# Patient Record
Sex: Male | Born: 2000 | Race: White | Hispanic: No | Marital: Single | Smoking: Never smoker
Health system: Southern US, Community
[De-identification: ages and names within clinical notes are randomized; demographics above are authoritative.]

## PROBLEM LIST (undated history)

## (undated) DIAGNOSIS — F909 Attention-deficit hyperactivity disorder, unspecified type: Secondary | ICD-10-CM

---
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
## 2018-02-27 DIAGNOSIS — F900 Attention-deficit hyperactivity disorder, predominantly inattentive type: Secondary | ICD-10-CM | POA: Diagnosis not present

## 2018-02-27 DIAGNOSIS — Z113 Encounter for screening for infections with a predominantly sexual mode of transmission: Secondary | ICD-10-CM | POA: Diagnosis not present

## 2018-02-27 DIAGNOSIS — Z713 Dietary counseling and surveillance: Secondary | ICD-10-CM | POA: Diagnosis not present

## 2018-02-27 DIAGNOSIS — Z1331 Encounter for screening for depression: Secondary | ICD-10-CM | POA: Diagnosis not present

## 2018-02-27 DIAGNOSIS — Z13220 Encounter for screening for lipoid disorders: Secondary | ICD-10-CM | POA: Diagnosis not present

## 2018-02-27 DIAGNOSIS — Z6828 Body mass index (BMI) 28.0-28.9, adult: Secondary | ICD-10-CM | POA: Diagnosis not present

## 2018-02-27 DIAGNOSIS — R030 Elevated blood-pressure reading, without diagnosis of hypertension: Secondary | ICD-10-CM | POA: Diagnosis not present

## 2018-02-27 DIAGNOSIS — Z0001 Encounter for general adult medical examination with abnormal findings: Secondary | ICD-10-CM | POA: Diagnosis not present

## 2018-07-31 DIAGNOSIS — F909 Attention-deficit hyperactivity disorder, unspecified type: Secondary | ICD-10-CM | POA: Diagnosis not present

## 2018-11-30 ENCOUNTER — Other Ambulatory Visit: Payer: Self-pay

## 2018-11-30 DIAGNOSIS — Z20822 Contact with and (suspected) exposure to covid-19: Secondary | ICD-10-CM

## 2018-11-30 DIAGNOSIS — Z20828 Contact with and (suspected) exposure to other viral communicable diseases: Secondary | ICD-10-CM | POA: Diagnosis not present

## 2018-12-01 LAB — NOVEL CORONAVIRUS, NAA: SARS-CoV-2, NAA: NOT DETECTED

## 2018-12-13 DIAGNOSIS — Z23 Encounter for immunization: Secondary | ICD-10-CM | POA: Diagnosis not present

## 2018-12-31 DIAGNOSIS — F900 Attention-deficit hyperactivity disorder, predominantly inattentive type: Secondary | ICD-10-CM | POA: Diagnosis not present

## 2019-02-20 ENCOUNTER — Ambulatory Visit: Payer: BC Managed Care – PPO | Attending: Internal Medicine

## 2019-02-20 DIAGNOSIS — Z20828 Contact with and (suspected) exposure to other viral communicable diseases: Secondary | ICD-10-CM | POA: Diagnosis not present

## 2019-02-20 DIAGNOSIS — Z20822 Contact with and (suspected) exposure to covid-19: Secondary | ICD-10-CM

## 2019-02-21 LAB — NOVEL CORONAVIRUS, NAA: SARS-CoV-2, NAA: NOT DETECTED

## 2019-04-02 DIAGNOSIS — F902 Attention-deficit hyperactivity disorder, combined type: Secondary | ICD-10-CM | POA: Diagnosis not present

## 2019-05-29 DIAGNOSIS — Z20828 Contact with and (suspected) exposure to other viral communicable diseases: Secondary | ICD-10-CM | POA: Diagnosis not present

## 2019-05-30 DIAGNOSIS — Z20828 Contact with and (suspected) exposure to other viral communicable diseases: Secondary | ICD-10-CM | POA: Diagnosis not present

## 2019-05-30 DIAGNOSIS — Z03818 Encounter for observation for suspected exposure to other biological agents ruled out: Secondary | ICD-10-CM | POA: Diagnosis not present

## 2019-07-04 DIAGNOSIS — F902 Attention-deficit hyperactivity disorder, combined type: Secondary | ICD-10-CM | POA: Diagnosis not present

## 2019-10-03 DIAGNOSIS — F902 Attention-deficit hyperactivity disorder, combined type: Secondary | ICD-10-CM | POA: Diagnosis not present

## 2020-08-06 ENCOUNTER — Encounter (HOSPITAL_BASED_OUTPATIENT_CLINIC_OR_DEPARTMENT_OTHER): Payer: BC Managed Care – PPO | Attending: Internal Medicine | Admitting: Internal Medicine

## 2020-08-06 ENCOUNTER — Other Ambulatory Visit (HOSPITAL_COMMUNITY)
Admission: RE | Admit: 2020-08-06 | Discharge: 2020-08-06 | Disposition: A | Discharge status: Home / Self Care | Payer: BC Managed Care – PPO | Source: Other Acute Inpatient Hospital | Attending: Internal Medicine | Admitting: Internal Medicine

## 2020-08-06 ENCOUNTER — Other Ambulatory Visit: Payer: Self-pay

## 2020-08-06 DIAGNOSIS — L600 Ingrowing nail: Secondary | ICD-10-CM | POA: Diagnosis not present

## 2020-08-06 DIAGNOSIS — L03031 Cellulitis of right toe: Secondary | ICD-10-CM | POA: Insufficient documentation

## 2020-08-06 NOTE — Progress Notes (Signed)
JOHNCARLOS, HOLTSCLAW ([id number]) Visit Report for 08/06/2020 Abuse/Suicide Risk Screen Details Patient Name: Date of Service: Ulice Dash MIN 08/06/2020 9:00 A M Medical Record Number: [id number] Patient Account Number: [id number] Date of Birth/Sex: Treating RN: 09-15-2000 (20 y.o. Male) Antonieta Iba Primary Care Annel Zunker: Ronalee Belts NET Other Clinician: Referring Alexandr Yaworski: Treating Kellye Mizner/Extender: Barbaraann Share NET Weeks in Treatment: 0 Abuse/Suicide Risk Screen Items Answer ABUSE RISK SCREEN: Has anyone close to you tried to hurt or harm you recentlyo No Do you feel uncomfortable with anyone in your familyo No Has anyone forced you do things that you didnt want to doo No Electronic Signature(s) Signed: 08/06/2020 6:07:06 PM By: Antonieta Iba Entered By: Antonieta Iba on 08/06/2020 09:05:08 -------------------------------------------------------------------------------- Activities of Daily Living Details Patient Name: Date of Service: Ulice Dash MIN 08/06/2020 9:00 A M Medical Record Number: [id number] Patient Account Number: [id number] Date of Birth/Sex: Treating RN: [birth year] (20 y.o. Male) Antonieta Iba Primary Care Tryton Bodi: Ronalee Belts NET Other Clinician: Referring Olvin Rohr: Treating Drayk Humbarger/Extender: Barbaraann Share NET Weeks in Treatment: 0 Activities of Daily Living Items Answer Activities of Daily Living (Please select one for each item) Drive Automobile Completely Able T Medications ake Completely Able Use T elephone Completely Able Care for Appearance Completely Able Use T oilet Completely Able Bath / Shower Completely Able Dress Self Completely Able Feed Self Completely Able Walk Completely Able Get In / Out Bed Completely Able Housework Completely Able Prepare Meals Completely Able Handle Money Completely Able Shop for Self Completely Able Electronic Signature(s) Signed: 08/06/2020 6:07:06 PM By: Antonieta Iba Entered By: Antonieta Iba on 08/06/2020 09:05:26 -------------------------------------------------------------------------------- Education Screening Details Patient Name: Date of Service: Ulice Dash MIN 08/06/2020 9:00 A M Medical Record Number: [id number] Patient Account Number: [id number] Date of Birth/Sex: Treating RN: May 30, 2000 (20 y.o. Male) Antonieta Iba Primary Care Keiley Levey: Ronalee Belts NET Other Clinician: Referring Kim Lauver: Treating Monda Chastain/Extender: Barbaraann Share NET Weeks in Treatment: 0 Primary Learner Assessed: Patient Learning Preferences/Education Level/Primary Language Learning Preference: Explanation, Demonstration, Printed Material Highest Education Level: College or Above Preferred Language: English Cognitive Barrier Language Barrier: No Translator Needed: No Memory Deficit: No Emotional Barrier: No Cultural/Religious Beliefs Affecting Medical Care: No Physical Barrier Impaired Vision: No Impaired Hearing: No Decreased Hand dexterity: No Knowledge/Comprehension Knowledge Level: High Comprehension Level: High Ability to understand written instructions: High Ability to understand verbal instructions: High Motivation Anxiety Level: Calm Cooperation: Cooperative Education Importance: Acknowledges Need Interest in Health Problems: Asks Questions Perception: Coherent Willingness to Engage in Self-Management High Activities: Readiness to Engage in Self-Management High Activities: Electronic Signature(s) Signed: 08/06/2020 6:07:06 PM By: Antonieta Iba Entered By: Antonieta Iba on 08/06/2020 09:05:55 -------------------------------------------------------------------------------- Fall Risk Assessment Details Patient Name: Date of Service: [name], [name] MIN 08/06/2020 9:00 A M Medical Record Number: [id number] Patient Account Number: [id number] Date of Birth/Sex: Treating RN: January 25, 2001 (20 y.o. Male) Antonieta Iba Primary Care Haylie Mccutcheon: Ronalee Belts NET Other  Clinician: Referring Tayshon Winker: Treating Orbin Mayeux/Extender: Barbaraann Share NET Weeks in Treatment: 0 Fall Risk Assessment Items Have you had 2 or more falls in the last 12 monthso 0 No Have you had any fall that resulted in injury in the last 12 monthso 0 No FALLS RISK SCREEN History of falling - immediate or within 3 months 0 No Secondary diagnosis (Do you have 2 or more medical diagnoseso) 0 No Ambulatory aid None/bed rest/wheelchair/nurse 0 Yes Crutches/cane/walker 0 No Furniture 0 No Intravenous therapy Access/Saline/Heparin Lock 0 No Gait/Transferring Normal/ bed rest/ wheelchair  0 Yes Weak (short steps with or without shuffle, stooped but able to lift head while walking, may seek 0 No support from furniture) Impaired (short steps with shuffle, may have difficulty arising from chair, head down, impaired 0 No balance) Mental Status Oriented to own ability 0 Yes Electronic Signature(s) Signed: 08/06/2020 6:07:06 PM By: Antonieta Iba Entered By: Antonieta Iba on 08/06/2020 09:06:14 -------------------------------------------------------------------------------- Foot Assessment Details Patient Name: Date of Service: Ulice Dash MIN 08/06/2020 9:00 A M Medical Record Number: [id number] Patient Account Number: [id number] Date of Birth/Sex: Treating RN: [birth year] (20 y.o. Male) Antonieta Iba Primary Care Adriaan Maltese: Ronalee Belts NET Other Clinician: Referring Gisel Vipond: Treating Jayshawn Colston/Extender: Barbaraann Share NET Weeks in Treatment: 0 Foot Assessment Items Site Locations + = Sensation present, - = Sensation absent, C = Callus, U = Ulcer R = Redness, W = Warmth, M = Maceration, PU = Pre-ulcerative lesion F = Fissure, S = Swelling, D = Dryness Assessment Right: Left: Other Deformity: No No Prior Foot Ulcer: No No Prior Amputation: No No Charcot Joint: No No Ambulatory Status: Ambulatory Without Help Gait: Steady Electronic Signature(s) Signed:  08/06/2020 6:07:06 PM By: Antonieta Iba Entered By: Antonieta Iba on 08/06/2020 09:07:48 -------------------------------------------------------------------------------- Nutrition Risk Screening Details Patient Name: Date of Service: Ulice Dash MIN 08/06/2020 9:00 A M Medical Record Number: [id number] Patient Account Number: [id number] Date of Birth/Sex: Treating RN: May 10, 2000 (20 y.o. Male) Antonieta Iba Primary Care Pineiro Bosler: Ronalee Belts NET Other Clinician: Referring Maliah Pyles: Treating Kassy Mcenroe/Extender: Barbaraann Share NET Weeks in Treatment: 0 Height (in): 70 Weight (lbs): 213 Body Mass Index (BMI): 30.6 Nutrition Risk Screening Items Score Screening NUTRITION RISK SCREEN: I have an illness or condition that made me change the kind and/or amount of food I eat 0 No I eat fewer than two meals per day 0 No I eat few fruits and vegetables, or milk products 0 No I have three or more drinks of beer, liquor or wine almost every day 0 No I have tooth or mouth problems that make it hard for me to eat 0 No I don't always have enough money to buy the food I need 0 No I eat alone most of the time 0 No I take three or more different prescribed or over-the-counter drugs a day 0 No Without wanting to, I have lost or gained 10 pounds in the last six months 0 No I am not always physically able to shop, cook and/or feed myself 0 No Nutrition Protocols Good Risk Protocol 0 No interventions needed Moderate Risk Protocol High Risk Proctocol Risk Level: Good Risk Score: 0 Electronic Signature(s) Signed: 08/06/2020 6:07:06 PM By: Antonieta Iba Entered By: Antonieta Iba on 08/06/2020 54:65:03

## 2020-08-07 NOTE — Progress Notes (Signed)
DONSHAY, LUPINSKI ([id number]) Visit Report for 08/06/2020 Chief Complaint Document Details Patient Name: Date of Service: Mark [name] 08/06/2020 9:00 A M Medical Record Number: [id number] Patient Account Number: [id number] Date of Birth/Sex: Treating RN: [birth year] (20 y.o. Male) Shawn Stall Primary Care Provider: Ronalee Belts NET Other Clinician: Referring Provider: Treating Provider/Extender: Barbaraann Share NET Weeks in Treatment: 0 Information Obtained from: Patient Chief Complaint Patient is here for review of an area on his right first toe Electronic Signature(s) Signed: 08/07/2020 10:33:35 AM By: Baltazar Najjar MD Entered By: Baltazar Najjar on 08/06/2020 09:59:02 -------------------------------------------------------------------------------- HPI Details Patient Name: Date of Service: Mark [name] 08/06/2020 9:00 A M Medical Record Number: [id number] Patient Account Number: [id number] Date of Birth/Sex: Treating RN: 2000-03-18 (20 y.o. Male) Shawn Stall Primary Care Provider: Ronalee Belts NET Other Clinician: Referring Provider: Treating Provider/Extender: Barbaraann Share NET Weeks in Treatment: 0 History of Present Illness HPI Description: ADMISSION 08/06/2020 This is a 20 year old healthy young man. The only thing I can see in [place] link is that he was seen by podiatry in 2015x2 visits and then again a year later in 2016. Most recently by Dr. Charlsie Merles. 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 He underwent a partial nail removal on 08/22/2014 he was recommended to do Epson salts and topical antibiotics. The patient states that this resolved he states about a year ago he had started having a problem again. He has tried Epson salts again and topical antibiotics but it has not really helped. He says it drains minimally and the pain is minimal. I am not really convinced he has been using anything recently. Past medical  history includes ADHD. 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 Male) Shawn Stall Primary Care Provider: Ronalee Belts NET Other Clinician: Referring Provider: Treating Provider/Extender: Barbaraann Share NET Weeks in Treatment: 0 Constitutional Sitting or standing Blood Pressure is within target range for patient.. Pulse regular and within target range for patient.Marland Kitchen Respirations regular, non-labored and within target range.. Temperature is normal and within the target range for the patient.Marland Kitchen Appears in no distress. Cardiovascular Pedal pulses are robust on the right. Notes Wound exam; the areas on the medial fold of the right first toenail. Swollen and erythematous with proud tissue. Not as tender as an acute paronychia. I pulled this back and was able to obtain a scant amount of material that looks somewhat purulent I obtained this for culture clearly the nail is ingrown into this area. It looks as though he keeps the nail very closely trimmed Electronic Signature(s) Signed: 08/07/2020 10:33:35 AM By: Baltazar Najjar MD Entered By: Baltazar Najjar on 08/06/2020 10:02:05 -------------------------------------------------------------------------------- Physician Orders Details Patient Name: Date of Service: Mark [name] 08/06/2020 9:00 A M Medical Record Number: [id number] Patient Account Number: [id number] Date of Birth/Sex: Treating RN: Aug 05, 2000 (20 y.o. Male) Shawn Stall Primary Care Provider: Ronalee Belts NET Other Clinician: Referring Provider: Treating Provider/Extender: Barbaraann Share NET Weeks in Treatment: 0 Verbal / Phone Orders: No Diagnosis Coding Follow-up Appointments ppointment in 1 week. - Dr. Leanord Hawking Return A Bathing/ Shower/ Hygiene May shower and wash wound with soap and water. Additional  Orders / Instructions Other: - pick oral antibiotics at pharmacy. may soak foot with Epsom salt as needed. Wound Treatment Wound #1 - T Great oe Wound Laterality: Right Cleanser: Soap and Water 1 x Per Day Discharge Instructions: May shower and wash wound with dial antibacterial soap and water prior to dressing change. Prim Dressing: triple antibiotic ointment- polysporin or neopsorin 1 x Per Day ary Discharge Instructions: apply ointment down into corner of right great toe wound. Secondary Dressing: bandaid 1 x Per Day Discharge Instructions: bandaid Laboratory erobe culture (MICRO) - culture of right great toe wound. Bacteria identified in Unspecified specimen by A LOINC Code: 634-6 Convenience Name: Areobic culture-specimen not specified Electronic Signature(s) Signed: 08/06/2020 5:31:56 PM By: Shawn Stall Signed: 08/07/2020 10:33:35 AM By: Baltazar Najjar MD Entered By: Shawn Stall on 08/06/2020 09:36:59 -------------------------------------------------------------------------------- Problem List Details Patient Name: Date of Service: Mark [name] [name] 08/06/2020 9:00 A M Medical Record Number: [id number] Patient Account Number: [id number] Date of Birth/Sex: Treating RN: 2001/01/30 (20 y.o. Male) Shawn Stall Primary Care Provider: Ronalee Belts NET Other Clinician: Referring Provider: Treating Provider/Extender: Maricela Bo, JA NET Weeks in Treatment: 0 Active Problems ICD-10 Encounter Code Description Active Date MDM Diagnosis L03.031 Cellulitis of right toe 08/06/2020 No Yes L60.0 Ingrowing nail 08/06/2020 No Yes Inactive Problems Resolved Problems Electronic Signature(s) Signed: 08/07/2020 10:33:35 AM By: Baltazar Najjar  MD Entered By: Baltazar Najjar on 08/06/2020 09:56:32 -------------------------------------------------------------------------------- Progress Note Details Patient Name: Date of Service: Mark [name] 08/06/2020 9:00 A M Medical Record Number: [id number] Patient Account Number: [id number] Date of Birth/Sex: Treating RN: 07-07-2000 (20 y.o. Male) Shawn Stall Primary Care Provider: Ronalee Belts NET Other Clinician: Referring Provider: Treating Provider/Extender: Barbaraann Share NET Weeks in Treatment: 0 Subjective Chief Complaint Information obtained from Patient Patient is here for review of an area on his right first toe History of Present Illness (HPI) ADMISSION 08/06/2020 This is a 20 year old healthy young man. The only thing I can see in [place] link is that he was seen by podiatry in 2015x2 visits and then again a year later in 2016. Most recently by Dr. Charlsie Merles. 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 He underwent a partial nail removal on 08/22/2014 he was recommended to do Epson salts and topical antibiotics. The patient states that this resolved he states about a year ago he had started having a problem again. He has tried Epson salts again and topical antibiotics but it has not really helped. He says it drains minimally and the pain is minimal. I am not really convinced he has been using anything recently. Past medical history includes ADHD. He is not a diabetic Patient History Information obtained from Patient. Allergies No Known Allergies Family History Cancer - Paternal Grandparents,Maternal Grandparents, No family history of Diabetes, Heart Disease, Hereditary Spherocytosis, Hypertension, Kidney Disease, Lung Disease, Seizures, Stroke, Thyroid Problems, Tuberculosis. Social History Never smoker, Marital Status - Single, Alcohol Use - Moderate, Drug Use - Prior History, Caffeine Use - Rarely. Medical A Surgical History  Notes nd Neurologic ADHD Review of Systems (ROS) Eyes Denies complaints or symptoms of Dry Eyes, Vision Changes, Glasses / Contacts. Ear/Nose/Mouth/Throat Denies complaints or symptoms of Chronic sinus problems or rhinitis. Respiratory Denies complaints or symptoms of Chronic or frequent coughs, Shortness of Breath. Cardiovascular Denies complaints or symptoms of Chest pain. Gastrointestinal Denies complaints or symptoms of Frequent diarrhea, Nausea, Vomiting. Endocrine Denies complaints or symptoms of Heat/cold intolerance. Genitourinary Denies complaints or  symptoms of Frequent urination. Integumentary (Skin) Denies complaints or symptoms of Wounds. Musculoskeletal Denies complaints or symptoms of Muscle Pain, Muscle Weakness. Psychiatric Denies complaints or symptoms of Claustrophobia, Suicidal. Objective Constitutional Sitting or standing Blood Pressure is within target range for patient.. Pulse regular and within target range for patient.Marland Kitchen Respirations regular, non-labored and within target range.. Temperature is normal and within the target range for the patient.Marland Kitchen Appears in no distress. Vitals Time Taken: 9:01 AM, Height: 70 in, Source: Stated, Weight: 213 lbs, Source: Stated, BMI: 30.6, Temperature: 97.9 F, Pulse: 77 bpm, Respiratory Rate: 16 breaths/[name], Blood Pressure: 123/81 mmHg. Cardiovascular Pedal pulses are robust on the right. General Notes: Wound exam; the areas on the medial fold of the right first toenail. Swollen and erythematous with proud tissue. Not as tender as an acute paronychia. I pulled this back and was able to obtain a scant amount of material that looks somewhat purulent I obtained this for culture clearly the nail is ingrown into this area. It looks as though he keeps the nail very closely trimmed Integumentary (Hair, Skin) Wound #1 status is Open. Original cause of wound was Other Lesion. The date acquired was: 08/20/2019. The wound is located  on the Right T Great. The oe wound measures 0.9cm length x 0.3cm width x 0.2cm depth; 0.212cm^2 area and 0.042cm^3 volume. There is Fat Layer (Subcutaneous Tissue) exposed. There is undermining starting at 5:00 and ending at 6:00 with a maximum distance of 0.2cm. There is a medium amount of serosanguineous drainage noted. The wound margin is distinct with the outline attached to the wound base. There is large (67-100%) red granulation within the wound bed. There is no necrotic tissue within the wound bed. Assessment Active Problems ICD-10 Cellulitis of right toe Ingrowing nail Plan Follow-up Appointments: Return Appointment in 1 week. - Dr. Leanord Hawking Bathing/ Shower/ Hygiene: May shower and wash wound with soap and water. Additional Orders / Instructions: Other: - pick oral antibiotics at pharmacy. may soak foot with Epsom salt as needed. Laboratory ordered were: Areobic culture-specimen not specified - culture of right great toe wound. WOUND #1: - T Great Wound Laterality: Right oe Cleanser: Soap and Water 1 x Per Day/ Discharge Instructions: May shower and wash wound with dial antibacterial soap and water prior to dressing change. Prim Dressing: triple antibiotic ointment- polysporin or neopsorin 1 x Per Day/ ary Discharge Instructions: apply ointment down into corner of right great toe wound. Secondary Dressing: bandaid 1 x Per Day/ Discharge Instructions: bandaid 1. Culture obtained 2. I have recommended going back to the Epson salts followed by topical triple antibiotic that he should probe into the area using a Q-tip. I have warned about contaminating himself with his fingers. I would like him to keep this covered and change daily. 3. I have not given him systemic antibiotics or antifungals. I will wait for the culture to come back 4. 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 This could be bacterial or fungal. I suspect he will probably need the nail  removed. He would need a nerve block to do this I think podiatry is better suited to do this then this clinic and I would refer him back to [facility] if we can get this to settle down. Electronic Signature(s) Signed: 08/07/2020 10:33:35 AM By: Baltazar Najjar MD Entered By: Baltazar Najjar on 08/06/2020 10:03:49 -------------------------------------------------------------------------------- HxROS Details Patient Name: Date of Service: Mark [name], Mark [name] 08/06/2020 9:00 A M Medical Record Number:  [id number] Patient Account Number: [id number] Date of Birth/Sex: Treating RN: 2000-09-29 (20 y.o. Male) Antonieta IbaBarnhart, Jodi Primary Care Provider: Ronalee BeltsEES, JA NET Other Clinician: Referring Provider: Treating Provider/Extender: Barbaraann Shareobson, Hakeen Shipes DEES, JA NET Weeks in Treatment: 0 Information Obtained From Patient Eyes Complaints and Symptoms: Negative for: Dry Eyes; Vision Changes; Glasses / Contacts Ear/Nose/Mouth/Throat Complaints and Symptoms: Negative for: Chronic sinus problems or rhinitis Respiratory Complaints and Symptoms: Negative for: Chronic or frequent coughs; Shortness of Breath Cardiovascular Complaints and Symptoms: Negative for: Chest pain Gastrointestinal Complaints and Symptoms: Negative for: Frequent diarrhea; Nausea; Vomiting Endocrine Complaints and Symptoms: Negative for: Heat/cold intolerance Genitourinary Complaints and Symptoms: Negative for: Frequent urination Integumentary (Skin) Complaints and Symptoms: Negative for: Wounds Musculoskeletal Complaints and Symptoms: Negative for: Muscle Pain; Muscle Weakness Psychiatric Complaints and Symptoms: Negative for: Claustrophobia; Suicidal Hematologic/Lymphatic Immunological Neurologic Medical History: Past Medical History Notes: ADHD Oncologic Immunizations Pneumococcal Vaccine: Received Pneumococcal Vaccination: No Implantable Devices None Family and Social History Cancer: Yes - Paternal  Grandparents,Maternal Grandparents; Diabetes: No; Heart Disease: No; Hereditary Spherocytosis: No; Hypertension: No; Kidney Disease: No; Lung Disease: No; Seizures: No; Stroke: No; Thyroid Problems: No; Tuberculosis: No; Never smoker; Marital Status - Single; Alcohol Use: Moderate; Drug Use: Prior History; Caffeine Use: Rarely; Financial Concerns: No; Food, Clothing or Shelter Needs: No; Support System Lacking: No; Transportation Concerns: No Electronic Signature(s) Signed: 08/06/2020 6:07:06 PM By: Antonieta IbaBarnhart, Jodi Signed: 08/07/2020 10:33:35 AM By: Baltazar Najjarobson, Kimbely Whiteaker MD Entered By: Antonieta IbaBarnhart, Jodi on 08/06/2020 09:05:00 -------------------------------------------------------------------------------- SuperBill Details Patient Name: Date of Service: Mark DashHA [name], Mark [name] 08/06/2020 Medical Record Number: [id number] Patient Account Number: [id number] Date of Birth/Sex: Treating RN: 2000-09-29 (20 y.o. Male) Shawn Stalleaton, Bobbi Primary Care Provider: Ronalee BeltsEES, JA NET Other Clinician: Referring Provider: Treating Provider/Extender: Maricela Boobson, Donisha Hoch DEES, JA NET Weeks in Treatment: 0 Diagnosis Coding ICD-10 Codes Code Description L03.031 Cellulitis of right toe L60.0 Ingrowing nail Facility Procedures CPT4 Code: [id number] Description: 99214 - WOUND CARE VISIT-LEV 4 EST PT Modifier: Quantity: 1 Physician Procedures : CPT4 Code Description Modifier [id number] - WC PHYS LEVEL 2 - NEW PT ICD-10 Diagnosis Description L03.031 Cellulitis of right toe L60.0 Ingrowing nail Quantity: 1 Electronic Signature(s) Signed: 08/07/2020 10:33:35 AM By: Baltazar Najjarobson, Roma Bierlein MD Entered By: Baltazar Najjarobson, Rae Plotner on 08/06/2020 10:04:13

## 2020-08-07 NOTE — Progress Notes (Signed)
Mark [name], Mark [name] ([id number]) Visit Report for 08/06/2020 Allergy List Details Patient Name: Date of Service: Mark [name] 08/06/2020 9:00 A M Medical Record Number: [id number] Patient Account Number: [id number] Date of Birth/Sex: Treating RN: 03-11-2000 (20 y.o. Male) Antonieta Iba Primary Care Sina Sumpter: Ronalee Belts NET Other Clinician: Referring Silverio Hagan: Treating Shadman Tozzi/Extender: Maricela Bo, JA NET Weeks in Treatment: 0 Allergies Active Allergies No Known Allergies Allergy Notes Electronic Signature(s) Signed: 08/06/2020 6:07:06 PM By: Antonieta Iba Entered By: Antonieta Iba on 08/06/2020 09:03:16 -------------------------------------------------------------------------------- Arrival Information Details Patient Name: Date of Service: Mark [name] 08/06/2020 9:00 A M Medical Record Number: [id number] Patient Account Number: [id number] Date of Birth/Sex: Treating RN: 2000/09/11 (20 y.o. Male) Antonieta Iba Primary Care Mckinley Adelstein: Ronalee Belts NET Other Clinician: Referring Tirrell Buchberger: Treating Camry Theiss/Extender: Barbaraann Share NET Weeks in Treatment: 0 Visit Information Patient Arrived: Ambulatory Arrival Time: 09:02 Transfer Assistance: None Patient Identification Verified: Yes Secondary Verification Process Completed: Yes Patient Requires Transmission-Based Precautions: No Patient Has Alerts: No Electronic Signature(s) Signed: 08/06/2020 6:07:06 PM By: Antonieta Iba Entered By: Antonieta Iba on 08/06/2020 09:02:42 -------------------------------------------------------------------------------- Clinic Level of Care Assessment Details Patient Name: Date of Service: Mark [name] 08/06/2020 9:00 A M Medical Record Number: [id number] Patient Account Number: [id number] Date of Birth/Sex: Treating RN: 2000/04/25 (20 y.o. Male) Shawn Stall Primary Care Jettson Crable: Other Clinician: Ronalee Belts NET Referring Anaiza Behrens: Treating Deondra Wigger/Extender: Maricela Bo, JA NET Weeks in Treatment: 0 Clinic Level of Care Assessment Items TOOL 2 Quantity Score X- 1 0 Use when only an EandM is performed on the INITIAL visit ASSESSMENTS - Nursing Assessment / Reassessment X- 1 20 General Physical Exam (combine w/ comprehensive assessment (listed just below) when performed on new pt. evals) X- 1 25 Comprehensive Assessment (HX, ROS, Risk Assessments, Wounds Hx, etc.) ASSESSMENTS - Wound and Skin A ssessment / Reassessment X - Simple Wound Assessment / Reassessment - one wound 1 5 []  - 0 Complex Wound Assessment / Reassessment - multiple wounds X- 1 10 Dermatologic / Skin Assessment (not related to wound area) ASSESSMENTS - Ostomy and/or Continence Assessment and Care []  - 0 Incontinence Assessment and Management []  - 0 Ostomy Care Assessment and Management (repouching, etc.) PROCESS - Coordination of Care X - Simple Patient / Family Education for ongoing care 1 15 []  - 0 Complex (extensive) Patient / Family Education for ongoing care X- 1 10 Staff obtains , Records, T Results / Process Orders est []  - 0 Staff telephones HHA, Nursing Homes / Clarify orders / etc []  - 0 Routine Transfer to another Facility (non-emergent condition) []  - 0 Routine Hospital Admission (non-emergent condition) []  - 0 New Admissions / / Ordering NPWT Apligraf, etc. , []  - 0 Emergency Hospital Admission (emergent condition) X- 1 10 Simple Discharge Coordination []  - 0 Complex (extensive) Discharge Coordination PROCESS - Special Needs []  - 0 Pediatric / Minor Patient Management []  - 0 Isolation Patient Management []  - 0 Hearing / Language / Visual special needs []  - 0 Assessment of Community assistance (transportation, D/C planning, etc.) []  - 0 Additional assistance / Altered mentation []  - 0 Support Surface(s) Assessment (bed, cushion, seat, etc.) INTERVENTIONS - Wound Cleansing / Measurement X- 1 5 Wound  Imaging (photographs - any number of wounds) []  - 0 Wound Tracing (instead of photographs) X- 1 5 Simple Wound Measurement - one wound []  - 0 Complex Wound Measurement - multiple wounds X- 1 5 Simple Wound Cleansing - one wound []  -  0 Complex Wound Cleansing - multiple wounds INTERVENTIONS - Wound Dressings X - Small Wound Dressing one or multiple wounds 1 10 []  - 0 Medium Wound Dressing one or multiple wounds []  - 0 Large Wound Dressing one or multiple wounds []  - 0 Application of Medications - injection INTERVENTIONS - Miscellaneous []  - 0 External ear exam X- 1 5 Specimen Collection (cultures, biopsies, blood, body fluids, etc.) []  - 0 Specimen(s) / Culture(s) sent or taken to Lab for analysis []  - 0 Patient Transfer (multiple staff / Lift / Similar devices) []  - 0 Simple Staple / Suture removal (25 or less) []  - 0 Complex Staple / Suture removal (26 or more) []  - 0 Hypo / Hyperglycemic Management (close monitor of Blood Glucose) []  - 0 Ankle / Brachial Index (ABI) - do not check if billed separately Has the patient been seen at the hospital within the last three years: Yes Total Score: 125 Level Of Care: New/Established - Level 4 Electronic Signature(s) Signed: 08/06/2020 5:31:56 PM By: Entered By: on 08/06/2020 09:56:48 -------------------------------------------------------------------------------- Encounter Discharge Information Details Patient Name: Date of Service: [name] 08/06/2020 9:00 A M Medical Record Number: Patient Account Number: Date of Birth/Sex: Treating RN: March 31, 2000 (20 y.o. Male) 08/08/2020 Primary Care Cheston Coury: Shawn Stall NET Other Clinician: Referring Phu Record: Treating Lenisha Lacap/Extender: Shawn Stall NET Weeks in Treatment: 0 Encounter Discharge Information Items Discharge Condition: Stable Ambulatory Status: Ambulatory Discharge Destination:  Home Transportation: Private Auto Accompanied By: self Schedule Follow-up Appointment: No Clinical Summary of Care: Patient Declined Electronic Signature(s) Signed: 08/06/2020 5:31:32 PM By: Mark Dash RN, BSN Entered By: 08/08/2020 on 08/06/2020 09:47:00 -------------------------------------------------------------------------------- Lower Extremity Assessment Details Patient Name: Date of Service: Mark [id number] [name] 08/06/2020 9:00 A M Medical Record Number: 1982 Patient Account Number: Zenaida Deed Date of Birth/Sex: Treating RN: [birth year] (20 y.o. Male) 08/08/2020 Primary Care Shilah Hefel: Zenaida Deed NET Other Clinician: Referring Verlisa Vara: Treating Corrie Reder/Extender: Zenaida Deed NET Weeks in Treatment: 0 Edema Assessment Assessed: [Left: No] [Right: Yes] Edema: [Left: N] [Right: o] Calf Left: Right: Point of Measurement: 35 cm From Medial Instep 41 cm Ankle Left: Right: Point of Measurement: 10 cm From Medial Instep 22.8 cm Vascular Assessment Pulses: Dorsalis Pedis Palpable: [Right:Yes] Doppler Audible: [Right:Yes] Blood Pressure: Brachial: [Right:123] Ankle: [Right:Dorsalis Pedis: 130 1.06] Electronic Signature(s) Signed: 08/06/2020 6:07:06 PM By: Gorden Harms Entered By: 08/08/2020 on 08/06/2020 09:10:59 -------------------------------------------------------------------------------- Multi Wound Chart Details Patient Name: Date of Service: Mark [id number] [name] 08/06/2020 9:00 A M Medical Record Number: 1982 Patient Account Number: Antonieta Iba Date of Birth/Sex: Treating RN: 2000/10/25 (20 y.o. Male) 08/08/2020 Primary Care Tywaun Hiltner: Antonieta Iba NET Other Clinician: Referring Cerina Leary: Treating Tangee Marszalek/Extender: Antonieta Iba NET Weeks in Treatment: 0 Vital Signs Height(in): 70 Pulse(bpm): 77 Weight(lbs): 213 Blood Pressure(mmHg): 123/81 Body Mass Index(BMI): 31 Temperature(F): 97.9 Respiratory  Rate(breaths/[name]): 16 Photos: [1:No Photos Right T Great oe] [N/A:N/A N/A] Wound Location: [1:Other Lesion] [N/A:N/A] Wounding Event: [1:Trauma, Other] [N/A:N/A] Primary Etiology: [1:08/20/2019] [N/A:N/A] Date Acquired: [1:0] [N/A:N/A] Weeks of Treatment: [1:Open] [N/A:N/A] Wound Status: [1:0.9x0.3x0.2] [N/A:N/A] Measurements L x W x D (cm) [1:0.212] [N/A:N/A] A (cm) : rea [1:0.042] [N/A:N/A] Volume (cm) : [1:5] Starting Position 1 (o'clock): [1:6] Ending Position 1 (o'clock): [1:0.2] Maximum Distance 1 (cm): [1:Yes] [N/A:N/A] Undermining: [1:Full Thickness Without Exposed] [N/A:N/A] Classification: [1:Support Structures Medium] [N/A:N/A] Exudate Amount: [1:Serosanguineous] [N/A:N/A] Exudate Type: [1:red, brown] [N/A:N/A] Exudate Color: [1:Distinct, outline attached] [N/A:N/A] Wound Margin: [  1:Large (67-100%)] [N/A:N/A] Granulation Amount: [1:Red] [N/A:N/A] Granulation Quality: [1:None Present (0%)] [N/A:N/A] Necrotic Amount: [1:Fat Layer (Subcutaneous Tissue): Yes N/A] Exposed Structures: [1:Fascia: No Tendon: No Muscle: No Joint: No Bone: No] Treatment Notes Wound #1 (Toe Great) Wound Laterality: Right Cleanser Soap and Water Discharge Instruction: May shower and wash wound with dial antibacterial soap and water prior to dressing change. Peri-Wound Care Topical Primary Dressing triple antibiotic ointment- polysporin or neopsorin Discharge Instruction: apply ointment down into corner of right great toe wound. Secondary Dressing bandaid Discharge Instruction: bandaid Secured With Compression Wrap Compression Stockings Add-Ons Electronic Signature(s) Signed: 08/06/2020 5:31:56 PM By: Shawn Stalleaton, Bobbi Signed: 08/07/2020 10:33:35 AM By: Baltazar Najjarobson, Michael MD Entered By: Baltazar Najjarobson, Michael on 08/06/2020 09:56:37 -------------------------------------------------------------------------------- Multi-Disciplinary Care Plan Details Patient Name: Date of Service: Mark [name], Mark [name]  08/06/2020 9:00 A M Medical Record Number: [id number] Patient Account Number: [id number] Date of Birth/Sex: Treating RN: 2000-06-16 (20 y.o. Male) Shawn Stalleaton, Bobbi Primary Care Antoinett Dorman: Ronalee BeltsEES, JA NET Other Clinician: Referring Jaclyn Carew: Treating Kalicia Dufresne/Extender: Barbaraann Shareobson, Michael DEES, JA NET Weeks in Treatment: 0 Active Inactive Nutrition Nursing Diagnoses: Potential for alteratiion in Nutrition/Potential for imbalanced nutrition Goals: Patient/caregiver agrees to and verbalizes understanding of need to obtain nutritional consultation Date Initiated: 08/06/2020 Target Resolution Date: 08/28/2020 Goal Status: Active Interventions: Provide education on nutrition Treatment Activities: Patient referred to Primary Care Physician for further nutritional evaluation : 08/06/2020 Notes: Orientation to the Wound Care Program Nursing Diagnoses: Knowledge deficit related to the wound healing center program Goals: Patient/caregiver will verbalize understanding of the Wound Healing Center Program Date Initiated: 08/06/2020 Target Resolution Date: 08/28/2020 Goal Status: Active Interventions: Provide education on orientation to the wound center Notes: Wound/Skin Impairment Nursing Diagnoses: Knowledge deficit related to ulceration/compromised skin integrity Goals: Patient/caregiver will verbalize understanding of skin care regimen Date Initiated: 08/06/2020 Target Resolution Date: 08/27/2020 Goal Status: Active Interventions: Assess patient/caregiver ability to obtain necessary supplies Assess patient/caregiver ability to perform ulcer/skin care regimen upon admission and as needed Provide education on ulcer and skin care Treatment Activities: Skin care regimen initiated : 08/06/2020 Topical wound management initiated : 08/06/2020 Notes: Electronic Signature(s) Signed: 08/06/2020 5:31:56 PM By: Shawn Stalleaton, Bobbi Entered By: Shawn Stalleaton, Bobbi on 08/06/2020  09:55:02 -------------------------------------------------------------------------------- Pain Assessment Details Patient Name: Date of Service: Mark DashHA ILE, Mark [name] 08/06/2020 9:00 A M Medical Record Number: [id number] Patient Account Number: [id number] Date of Birth/Sex: Treating RN: 2000-06-16 (20 y.o. Male) Antonieta IbaBarnhart, Jodi Primary Care Alon Mazor: Ronalee BeltsEES, JA NET Other Clinician: Referring Leoma Folds: Treating Aaliya Maultsby/Extender: Barbaraann Shareobson, Michael DEES, JA NET Weeks in Treatment: 0 Active Problems Location of Pain Severity and Description of Pain Patient Has Paino Yes Site Locations Pain Location: Pain Location: Pain in Ulcers With Dressing Change: Yes Duration of the Pain. Constant / Intermittento Intermittent Rate the pain. Current Pain Level: 3 Character of Pain Describe the Pain: Tender, Throbbing Pain Management and Medication Current Pain Management: Medication: Yes Cold Application: No Rest: Yes Massage: No Activity: No T.E.N.S.: No Heat Application: No Leg drop or elevation: No Is the Current Pain Management Adequate: Inadequate How does your wound impact your activities of daily livingo Sleep: No Bathing: No Appetite: No Relationship With Others: No Bladder Continence: No Emotions: No Bowel Continence: No Work: No Toileting: No Drive: No Dressing: No Hobbies: No Electronic Signature(s) Signed: 08/06/2020 6:07:06 PM By: Antonieta IbaBarnhart, Jodi Entered By: Antonieta IbaBarnhart, Jodi on 08/06/2020 09:15:00 -------------------------------------------------------------------------------- Patient/Caregiver Education Details Patient Name: Date of Service: Mark [name], Mark [name] 6/16/2022andnbsp9:00 A M Medical Record Number: [id number] Patient Account Number: [id number] Date of Birth/Gender: Treating  RN: [birth year] (20 y.o. Male) Shawn Stall Primary Care Physician: Ronalee Belts NET Other Clinician: Referring Physician: Treating Physician/Extender: Barbaraann Share NET Weeks in  Treatment: 0 Education Assessment Education Provided To: Patient Education Topics Provided Welcome [facility]: o Handouts: Welcome T [facility] o Methods: Explain/Verbal Responses: Reinforcements needed Electronic Signature(s) Signed: 08/06/2020 5:31:56 PM By: Shawn Stall Signed: 08/06/2020 5:31:56 PM By: Shawn Stall Entered By: Shawn Stall on 08/06/2020 09:56:16 -------------------------------------------------------------------------------- Wound Assessment Details Patient Name: Date of Service: Mark [name] 08/06/2020 9:00 A M Medical Record Number: [id number] Patient Account Number: [id number] Date of Birth/Sex: Treating RN: January 25, 2001 (20 y.o. Male) Antonieta Iba Primary Care Wilder Kurowski: Ronalee Belts NET Other Clinician: Referring Seung Nidiffer: Treating Damika Harmon/Extender: Barbaraann Share NET Weeks in Treatment: 0 Wound Status Wound Number: 1 Primary Etiology: Trauma, Other Wound Location: Right T Great oe Wound Status: Open Wounding Event: Other Lesion Date Acquired: 08/20/2019 Weeks Of Treatment: 0 Clustered Wound: No Photos Wound Measurements Length: (cm) 0.9 Width: (cm) 0.3 Depth: (cm) 0.2 Area: (cm) 0.212 Volume: (cm) 0.042 % Reduction in Area: 0% % Reduction in Volume: 0% Undermining: Yes Starting Position (o'clock): 5 Ending Position (o'clock): 6 Maximum Distance: (cm) 0.2 Wound Description Classification: Full Thickness Without Exposed Support Structures Wound Margin: Distinct, outline attached Exudate Amount: Medium Exudate Type: Serosanguineous Exudate Color: red, brown Foul Odor After Cleansing: No Slough/Fibrino No Wound Bed Granulation Amount: Large (67-100%) Exposed Structure Granulation Quality: Red Fascia Exposed: No Necrotic Amount: None Present (0%) Fat Layer (Subcutaneous Tissue) Exposed: Yes Tendon Exposed: No Muscle Exposed: No Joint Exposed: No Bone Exposed: No Treatment Notes Wound #1 (Toe  Great) Wound Laterality: Right Cleanser Soap and Water Discharge Instruction: May shower and wash wound with dial antibacterial soap and water prior to dressing change. Peri-Wound Care Topical Primary Dressing triple antibiotic ointment- polysporin or neopsorin Discharge Instruction: apply ointment down into corner of right great toe wound. Secondary Dressing bandaid Discharge Instruction: bandaid Secured With Compression Wrap Compression Stockings Add-Ons Electronic Signature(s) Signed: 08/06/2020 5:21:02 PM By: Karl Ito Signed: 08/06/2020 6:07:06 PM By: Antonieta Iba Entered By: Karl Ito on 08/06/2020 16:45:26 -------------------------------------------------------------------------------- Vitals Details Patient Name: Date of Service: Mark Gorden Harms [name] 08/06/2020 9:00 A M Medical Record Number: [id number] Patient Account Number: [id number] Date of Birth/Sex: Treating RN: 12-Oct-2000 (20 y.o. Male) Antonieta Iba Primary Care Margaux Engen: Ronalee Belts NET Other Clinician: Referring Zavion Sleight: Treating Miller Limehouse/Extender: Barbaraann Share NET Weeks in Treatment: 0 Vital Signs Time Taken: 09:01 Temperature (F): 97.9 Height (in): 70 Pulse (bpm): 77 Source: Stated Respiratory Rate (breaths/[name]): 16 Weight (lbs): 213 Blood Pressure (mmHg): 123/81 Source: Stated Reference Range: 80 - 120 mg / dl Body Mass Index (BMI): 30.6 Electronic Signature(s) Signed: 08/06/2020 6:07:06 PM By: Antonieta Iba Entered By: Antonieta Iba on 08/06/2020 09:03:11

## 2020-08-09 LAB — AEROBIC CULTURE W GRAM STAIN (SUPERFICIAL SPECIMEN): Gram Stain: NONE SEEN

## 2020-08-13 ENCOUNTER — Encounter (HOSPITAL_BASED_OUTPATIENT_CLINIC_OR_DEPARTMENT_OTHER): Payer: BC Managed Care – PPO | Admitting: Internal Medicine

## 2020-08-13 ENCOUNTER — Other Ambulatory Visit: Payer: Self-pay

## 2020-08-13 DIAGNOSIS — L03031 Cellulitis of right toe: Secondary | ICD-10-CM | POA: Diagnosis not present

## 2020-08-13 NOTE — Progress Notes (Signed)
Mark [name] ([id number]) Visit Report for 08/13/2020 HPI Details Patient Name: Date of Service: Mark [name] MIN 08/13/2020 9:00 A M Medical Record Number: [id number] Patient Account Number: [id number] Date of Birth/Sex: Treating RN: October 18, 2000 (20 y.o. Mark [name] Primary Care Provider: Ronalee [name] NET Other Clinician: Referring Provider: Treating Provider/Extender: Mark [name] NET Weeks in Treatment: 1 History of Present Illness HPI Description: ADMISSION 08/06/2020 This is a 20 year old healthy young man. The only thing I can see in [place] link is that he was seen by podiatry in 2015x2 visits and then again a year later in 2016. Most recently by Dr. Charlsie Merles. 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 He underwent a partial nail removal on 08/22/2014 he was recommended to do Epson salts and topical antibiotics. The patient states that this resolved he states about a year ago he had started having a problem again. He has tried Epson salts again and topical antibiotics but it has not really helped. He says it drains minimally and the pain is minimal. I am not really convinced he has been using anything recently. Past medical history includes ADHD. He is not a diabetic 6/23; this is a patient with a chronic paronychia. There was purulent drainage coming out of this last time culture grew abundant methicillin staph aureus as well as moderate Streptococcus. Based on this I gave him Keflex 500 every 6 for 7 days he is also using topical Polysporin. The area looks a lot better Electronic Signature(s) Signed: 08/13/2020 5:20:02 PM By: Mark Najjar MD Entered By: Mark [name] on 08/13/2020 09:59:13 -------------------------------------------------------------------------------- Physical Exam Details Patient Name: Date of Service: [name] MIN 08/13/2020 9:00 A M Medical Record Number: [id number] Patient Account Number: [id number] Date of  Birth/Sex: Treating RN: [birth year] (20 y.o. Mark [name] Primary Care Provider: Ronalee [name] NET Other Clinician: Referring Provider: Treating Provider/Extender: Mark [name] NET Weeks in Treatment: 1 Constitutional Sitting or standing Blood Pressure is within target range for patient.. Pulse regular and within target range for patient.Marland Kitchen Respirations regular, non-labored and within target range.. Temperature is normal and within the target range for the patient.Marland Kitchen Appears in no distress. Cardiovascular Pedal pulses are palpable. Notes Wound exam; left medial first toenail. Much less swelling no purulence. Electronic Signature(s) Signed: 08/13/2020 5:20:02 PM By: Mark Najjar MD Entered By: Mark [name] on 08/13/2020 10:01:03 -------------------------------------------------------------------------------- Physician Orders Details Patient Name: Date of Service: Mark [name] MIN 08/13/2020 9:00 A M Medical Record Number: [id number] Patient Account Number: [id number] Date of Birth/Sex: Treating RN: 10-Oct-2000 (20 y.o. Mark [name] Primary Care Provider: Ronalee [name] NET Other Clinician: Referring Provider: Treating Provider/Extender: Mark [name] NET Weeks in Treatment: 1 Verbal / Phone Orders: No Diagnosis Coding ICD-10 Coding Code Description L03.031 Cellulitis of right toe L60.0 Ingrowing nail Follow-up Appointments ppointment in 2 weeks. - Dr. Leanord Hawking Return A Bathing/ Shower/ Hygiene May shower and wash wound with soap and water. Additional Orders / Instructions Other: - finish oral antibiotics. may soak foot with Epsom salt as needed. Wound Treatment Wound #1 - T Great oe Wound Laterality: Right Cleanser: Soap and Water 1 x Per Day Discharge Instructions: May shower and wash wound with dial antibacterial soap and water prior to dressing change. Prim Dressing: triple antibiotic ointment- polysporin or neopsorin 1 x Per Day ary Discharge  Instructions: apply ointment down into corner of right great toe wound. Secondary Dressing: bandaid 1 x Per Day Discharge Instructions: bandaid  Electronic Signature(s) Signed: 08/13/2020 5:20:02 PM By: Mark Najjar MD Signed: 08/13/2020 5:46:50 PM By: Shawn Stall Entered By: Shawn Stall on 08/13/2020 09:26:04 -------------------------------------------------------------------------------- Problem List Details Patient Name: Date of Service: [name], [name] MIN 08/13/2020 9:00 A M Medical Record Number: [id number] Patient Account Number: [id number] Date of Birth/Sex: Treating RN: 07-Dec-2000 (20 y.o. Mark [name] Primary Care Provider: Ronalee [name] NET Other Clinician: Referring Provider: Treating Provider/Extender: Maricela Bo, JA NET Weeks in Treatment: 1 Active Problems ICD-10 Encounter Code Description Active Date MDM Diagnosis L03.031 Cellulitis of right toe 08/06/2020 No Yes L60.0 Ingrowing nail 08/06/2020 No Yes Inactive Problems Resolved Problems Electronic Signature(s) Signed: 08/13/2020 5:20:02 PM By: Mark Najjar MD Entered By: Mark [name] on 08/13/2020 09:58:14 -------------------------------------------------------------------------------- Progress Note Details Patient Name: Date of Service: [name] MIN 08/13/2020 9:00 A M Medical Record Number: [id number] Patient Account Number: [id number] Date of Birth/Sex: Treating RN: 06/15/2000 (20 y.o. Mark [name] Primary Care Provider: Ronalee [name] NET Other Clinician: Referring Provider: Treating Provider/Extender: Mark [name] NET Weeks in Treatment: 1 Subjective History of Present Illness (HPI) ADMISSION 08/06/2020 This is a 20 year old healthy young man. The only thing I can see in [place] link is that he was seen by podiatry in 2015x2 visits and then again a year later in 2016. Most recently by Dr. Charlsie Merles. 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  He underwent a partial nail removal on 08/22/2014 he was recommended to do Epson salts and topical antibiotics. The patient states that this resolved he states about a year ago he had started having a problem again. He has tried Epson salts again and topical antibiotics but it has not really helped. He says it drains minimally and the pain is minimal. I am not really convinced he has been using anything recently. Past medical history includes ADHD. He is not a diabetic 6/23; this is a patient with a chronic paronychia. There was purulent drainage coming out of this last time culture grew abundant methicillin staph aureus as well as moderate Streptococcus. Based on this I gave him Keflex 500 every 6 for 7 days he is also using topical Polysporin. The area looks a lot better Objective Constitutional Sitting or standing Blood Pressure is within target range for patient.. Pulse regular and within target range for patient.Marland Kitchen Respirations regular, non-labored and within target range.. Temperature is normal and within the target range for the patient.Marland Kitchen Appears in no distress. Vitals Time Taken: 8:55 AM, Height: 70 in, Weight: 213 lbs, BMI: 30.6, Temperature: 97.9 F, Pulse: 86 bpm, Respiratory Rate: 16 breaths/min, Blood Pressure: 124/87 mmHg. Cardiovascular Pedal pulses are palpable. General Notes: Wound exam; left medial first toenail. Much less swelling no purulence. Integumentary (Hair, Skin) Wound #1 status is Open. Original cause of wound was Other Lesion. The date acquired was: 08/20/2019. The wound has been in treatment 1 weeks. The wound is located on the Right T Great. The wound measures 0.4cm length x 0.3cm width x 0.1cm depth; 0.094cm^2 area and 0.009cm^3 volume. There is Fat Layer oe (Subcutaneous Tissue) exposed. There is no tunneling or undermining noted. There is a medium amount of serosanguineous drainage noted. The wound margin is distinct with the outline attached to the wound base.  There is large (67-100%) red granulation within the wound bed. There is no necrotic tissue within the wound bed. Assessment Active Problems ICD-10 Cellulitis of right toe Ingrowing nail Plan Follow-up Appointments: Return Appointment in 2 weeks. -  Dr. Leanord Hawking Bathing/ Shower/ Hygiene: May shower and wash wound with soap and water. Additional Orders / Instructions: Other: - finish oral antibiotics. may soak foot with Epsom salt as needed. WOUND #1: - T Great Wound Laterality: Right oe Cleanser: Soap and Water 1 x Per Day/ Discharge Instructions: May shower and wash wound with dial antibacterial soap and water prior to dressing change. Prim Dressing: triple antibiotic ointment- polysporin or neopsorin 1 x Per Day/ ary Discharge Instructions: apply ointment down into corner of right great toe wound. Secondary Dressing: bandaid 1 x Per Day/ Discharge Instructions: bandaid 1. We will continue to wash the area apply topical antibiotics. 2 he will complete the Keflex for the MSSA and strep 3. The area looks a lot better. If this becomes recurrent I suspect he is going to have to have the nail removed from this area and I will refer him back to podiatry for this Electronic Signature(s) Signed: 08/13/2020 5:20:02 PM By: Mark Najjar MD Entered By: Mark [name] on 08/13/2020 10:01:57 -------------------------------------------------------------------------------- SuperBill Details Patient Name: Date of Service: Mark [name] MIN 08/13/2020 Medical Record Number: [id number] Patient Account Number: [id number] Date of Birth/Sex: Treating RN: [birth year] (20 y.o. Mark [name] Primary Care Provider: Ronalee [name] NET Other Clinician: Referring Provider: Treating Provider/Extender: Mark [name] NET Weeks in Treatment: 1 Diagnosis Coding ICD-10 Codes Code Description L03.031 Cellulitis of right toe L60.0 Ingrowing nail Facility Procedures CPT4 Code: [id number] Description:  99213 - WOUND CARE VISIT-LEV 3 EST PT Modifier: Quantity: 1 Physician Procedures : CPT4 Code Description Modifier [id number] - WC PHYS LEVEL 3 - EST PT ICD-10 Diagnosis Description L03.031 Cellulitis of right toe L60.0 Ingrowing nail Quantity: 1 Electronic Signature(s) Signed: 08/13/2020 5:20:02 PM By: Mark Najjar MD Entered By: Mark [name] on 08/13/2020 10:02:12

## 2020-08-13 NOTE — Progress Notes (Signed)
LENON, KUENNEN ([id number]) Visit Report for 08/13/2020 Arrival Information Details Patient Name: Date of Service: Mark [name] MIN 08/13/2020 9:00 A M Medical Record Number: [id number] Patient Account Number: [id number] Date of Birth/Sex: Treating RN: 08/21/2000 (20 y.o. [name] Primary Care Viney Acocella: Ronalee Belts NET Other Clinician: Referring Amalya Salmons: Treating Navi Ewton/Extender: Barbaraann Share NET Weeks in Treatment: 1 Visit Information History Since Last Visit Added or deleted any medications: No Patient Arrived: Ambulatory Any new allergies or adverse reactions: No Arrival Time: 08:53 Had a fall or experienced change in No Accompanied By: self activities of daily living that may affect Transfer Assistance: None risk of falls: Patient Identification Verified: Yes Signs or symptoms of abuse/neglect since last visito No Secondary Verification Process Completed: Yes Hospitalized since last visit: No Patient Requires Transmission-Based Precautions: No Has Dressing in Place as Prescribed: Yes Patient Has Alerts: No Pain Present Now: No Electronic Signature(s) Signed: 08/13/2020 1:07:51 PM By: Karl Ito Entered By: Karl Ito on 08/13/2020 08:53:47 -------------------------------------------------------------------------------- Clinic Level of Care Assessment Details Patient Name: Date of Service: Mark [name] 08/13/2020 9:00 A M Medical Record Number: [id number] Patient Account Number: [id number] Date of Birth/Sex: Treating RN: [birth year] (20 y.o. [name] Primary Care Kimisha Eunice: Ronalee Belts NET Other Clinician: Referring Jessalyn Hinojosa: Treating Sheilyn Boehlke/Extender: Barbaraann Share NET Weeks in Treatment: 1 Clinic Level of Care Assessment Items TOOL 4 Quantity Score X- 1 0 Use when only an EandM is performed on FOLLOW-UP visit ASSESSMENTS - Nursing Assessment / Reassessment X- 1 10 Reassessment of Co-morbidities (includes updates in  patient status) X- 1 5 Reassessment of Adherence to Treatment Plan ASSESSMENTS - Wound and Skin A ssessment / Reassessment X - Simple Wound Assessment / Reassessment - one wound 1 5 []  - 0 Complex Wound Assessment / Reassessment - multiple wounds X- 1 10 Dermatologic / Skin Assessment (not related to wound area) ASSESSMENTS - Focused Assessment X- 1 5 Circumferential Edema Measurements - multi extremities X- 1 10 Nutritional Assessment / Counseling / Intervention []  - 0 Lower Extremity Assessment (monofilament, tuning fork, pulses) []  - 0 Peripheral Arterial Disease Assessment (using hand held doppler) ASSESSMENTS - Ostomy and/or Continence Assessment and Care []  - 0 Incontinence Assessment and Management []  - 0 Ostomy Care Assessment and Management (repouching, etc.) PROCESS - Coordination of Care X - Simple Patient / Family Education for ongoing care 1 15 []  - 0 Complex (extensive) Patient / Family Education for ongoing care X- 1 10 Staff obtains , Records, T Results / Process Orders est []  - 0 Staff telephones HHA, Nursing Homes / Clarify orders / etc []  - 0 Routine Transfer to another Facility (non-emergent condition) []  - 0 Routine Hospital Admission (non-emergent condition) []  - 0 New Admissions / / Ordering NPWT Apligraf, etc. , []  - 0 Emergency Hospital Admission (emergent condition) X- 1 10 Simple Discharge Coordination []  - 0 Complex (extensive) Discharge Coordination PROCESS - Special Needs []  - 0 Pediatric / Minor Patient Management []  - 0 Isolation Patient Management []  - 0 Hearing / Language / Visual special needs []  - 0 Assessment of Community assistance (transportation, D/C planning, etc.) []  - 0 Additional assistance / Altered mentation []  - 0 Support Surface(s) Assessment (bed, cushion, seat, etc.) INTERVENTIONS - Wound Cleansing / Measurement X - Simple Wound Cleansing - one wound 1 5 []  - 0 Complex Wound  Cleansing - multiple wounds X- 1 5 Wound Imaging (photographs - any number of wounds) []  - 0 Wound Tracing (  instead of photographs) X- 1 5 Simple Wound Measurement - one wound []  - 0 Complex Wound Measurement - multiple wounds INTERVENTIONS - Wound Dressings X - Small Wound Dressing one or multiple wounds 1 10 []  - 0 Medium Wound Dressing one or multiple wounds []  - 0 Large Wound Dressing one or multiple wounds []  - 0 Application of Medications - topical []  - 0 Application of Medications - injection INTERVENTIONS - Miscellaneous []  - 0 External ear exam []  - 0 Specimen Collection (cultures, biopsies, blood, body fluids, etc.) []  - 0 Specimen(s) / Culture(s) sent or taken to Lab for analysis []  - 0 Patient Transfer (multiple staff / Lift / Similar devices) []  - 0 Simple Staple / Suture removal (25 or less) []  - 0 Complex Staple / Suture removal (26 or more) []  - 0 Hypo / Hyperglycemic Management (close monitor of Blood Glucose) []  - 0 Ankle / Brachial Index (ABI) - do not check if billed separately X- 1 5 Vital Signs Has the patient been seen at the hospital within the last three years: Yes Total Score: 110 Level Of Care: New/Established - Level 3 Electronic Signature(s) Signed: 08/13/2020 5:46:50 PM By: Entered By: on 08/13/2020 09:29:47 -------------------------------------------------------------------------------- Encounter Discharge Information Details Patient Name: Date of Service: HA MIN 08/13/2020 9:00 A M Medical Record Number: Patient Account Number: Date of Birth/Sex: Treating RN: 12-02-2000 (20 y.o. Primary Care Kayzen Kendzierski: NET Other Clinician: Referring Tyre Beaver: Treating Virgal Warmuth/Extender: NET Weeks in Treatment: 1 Encounter Discharge Information Items Discharge Condition: Stable Ambulatory Status: Ambulatory Discharge Destination:  Home Transportation: Private Auto Accompanied By: self Schedule Follow-up Appointment: Yes Clinical Summary of Care: Electronic Signature(s) Signed: 08/13/2020 5:46:50 PM By: 08/15/2020 Entered By: Shawn Stall on 08/13/2020 09:31:35 -------------------------------------------------------------------------------- Lower Extremity Assessment Details Patient Name: Date of Service: 08/15/2020 MIN 08/13/2020 9:00 A M Medical Record Number: 08/15/2020 Patient Account Number: [id number] Date of Birth/Sex: Treating RN: 2000-07-15 (20 y.o. 03/15/2000 Primary Care Oaklie Durrett: [name] NET Other Clinician: Referring Hammad Finkler: Treating Lakina Mcintire/Extender: Ronalee Belts NET Weeks in Treatment: 1 Edema Assessment Assessed: [Left: No] [Right: Yes] Edema: [Left: N] [Right: o] Calf Left: Right: Point of Measurement: 35 cm From Medial Instep 40.4 cm Ankle Left: Right: Point of Measurement: 10 cm From Medial Instep 22 cm Vascular Assessment Pulses: Dorsalis Pedis Palpable: Electronic Signature(s) Signed: 08/13/2020 5:27:52 PM By: 08/15/2020 Entered By: Shawn Stall on 08/13/2020 08:56:43 -------------------------------------------------------------------------------- Multi Wound Chart Details Patient Name: Date of Service: HA 08/15/2020 MIN 08/13/2020 9:00 A M Medical Record Number: 08/15/2020 Patient Account Number: [id number] Date of Birth/Sex: Treating RN: 05/22/2000 (20 y.o. 03/15/2000 Primary Care Aster Screws: Lytle Michaels NET Other Clinician: Referring Aslyn Cottman: Treating Alin Chavira/Extender: Ronalee Belts NET Weeks in Treatment: 1 Vital Signs Height(in): 70 Pulse(bpm): 86 Weight(lbs): 213 Blood Pressure(mmHg): 124/87 Body Mass Index(BMI): 31 Temperature(F): 97.9 Respiratory Rate(breaths/min): 16 Photos: [1:No Photos Right T Great oe] [N/A:N/A N/A] Wound Location: [1:Other Lesion] [N/A:N/A] Wounding Event: [1:Trauma, Other]  [N/A:N/A] Primary Etiology: [1:08/20/2019] [N/A:N/A] Date Acquired: [1:1] [N/A:N/A] Weeks of Treatment: [1:Open] [N/A:N/A] Wound Status: [1:0.4x0.3x0.1] [N/A:N/A] Measurements L x W x D (cm) [1:0.094] [N/A:N/A] A (cm) : rea [1:0.009] [N/A:N/A] Volume (cm) : [1:55.70%] [N/A:N/A] % Reduction in A rea: [1:78.60%] [N/A:N/A] % Reduction in Volume: [1:Full Thickness Without Exposed] [N/A:N/A] Classification: [1:Support Structures Medium] [N/A:N/A] Exudate Amount: [1:Serosanguineous] [N/A:N/A] Exudate Type: [1:red, brown] [N/A:N/A] Exudate Color: [1:Distinct,  outline attached] [N/A:N/A] Wound Margin: [1:Large (67-100%)] [N/A:N/A] Granulation Amount: [1:Red] [N/A:N/A] Granulation Quality: [1:None Present (0%)] [N/A:N/A] Necrotic Amount: [1:Fat Layer (Subcutaneous Tissue): Yes N/A] Exposed Structures: [1:Fascia: No Tendon: No Muscle: No Joint: No Bone: No] Treatment Notes Wound #1 (Toe Great) Wound Laterality: Right Cleanser Soap and Water Discharge Instruction: May shower and wash wound with dial antibacterial soap and water prior to dressing change. Peri-Wound Care Topical Primary Dressing triple antibiotic ointment- polysporin or neopsorin Discharge Instruction: apply ointment down into corner of right great toe wound. Secondary Dressing bandaid Discharge Instruction: bandaid Secured With Compression Wrap Compression Stockings Add-Ons Electronic Signature(s) Signed: 08/13/2020 5:20:02 PM By: Baltazar Najjar MD Signed: 08/13/2020 5:46:50 PM By: Shawn Stall Entered By: Baltazar Najjar on 08/13/2020 09:58:19 -------------------------------------------------------------------------------- Multi-Disciplinary Care Plan Details Patient Name: Date of Service: [name] MIN 08/13/2020 9:00 A M Medical Record Number: [id number] Patient Account Number: [id number] Date of Birth/Sex: Treating RN: 04-24-2000 (20 y.o. [name] Primary Care Mendy Lapinsky: Ronalee Belts NET Other  Clinician: Referring Brielynn Sekula: Treating Skii Cleland/Extender: Barbaraann Share NET Weeks in Treatment: 1 Active Inactive Nutrition Nursing Diagnoses: Potential for alteratiion in Nutrition/Potential for imbalanced nutrition Goals: Patient/caregiver agrees to and verbalizes understanding of need to obtain nutritional consultation Date Initiated: 08/06/2020 Target Resolution Date: 08/28/2020 Goal Status: Active Interventions: Provide education on nutrition Treatment Activities: Patient referred to Primary Care Physician for further nutritional evaluation : 08/06/2020 Notes: Wound/Skin Impairment Nursing Diagnoses: Knowledge deficit related to ulceration/compromised skin integrity Goals: Patient/caregiver will verbalize understanding of skin care regimen Date Initiated: 08/06/2020 Target Resolution Date: 08/27/2020 Goal Status: Active Interventions: Assess patient/caregiver ability to obtain necessary supplies Assess patient/caregiver ability to perform ulcer/skin care regimen upon admission and as needed Provide education on ulcer and skin care Treatment Activities: Skin care regimen initiated : 08/06/2020 Topical wound management initiated : 08/06/2020 Notes: Electronic Signature(s) Signed: 08/13/2020 5:46:50 PM By: Shawn Stall Entered By: Shawn Stall on 08/13/2020 09:25:10 -------------------------------------------------------------------------------- Pain Assessment Details Patient Name: Date of Service: Mark [name] MIN 08/13/2020 9:00 A M Medical Record Number: [id number] Patient Account Number: [id number] Date of Birth/Sex: Treating RN: 2000-11-17 (20 y.o. Lytle Michaels Primary Care Jaclyn Carew: Ronalee Belts NET Other Clinician: Referring Lucynda Rosano: Treating Ethen Bannan/Extender: Barbaraann Share NET Weeks in Treatment: 1 Active Problems Location of Pain Severity and Description of Pain Patient Has Paino No Site Locations Pain Management and  Medication Current Pain Management: Electronic Signature(s) Signed: 08/13/2020 5:27:52 PM By: Antonieta Iba Entered By: Antonieta Iba on 08/13/2020 08:56:24 -------------------------------------------------------------------------------- Patient/Caregiver Education Details Patient Name: Date of Service: [name] MIN 6/23/2022andnbsp9:00 A M Medical Record Number: [id number] Patient Account Number: [id number] Date of Birth/Gender: Treating RN: 10-05-2000 (20 y.o. [name] Primary Care Physician: Ronalee Belts NET Other Clinician: Referring Physician: Treating Physician/Extender: Barbaraann Share NET Weeks in Treatment: 1 Education Assessment Education Provided To: Patient Education Topics Provided Nutrition: Handouts: Nutrition Methods: Explain/Verbal Responses: Reinforcements needed Electronic Signature(s) Signed: 08/13/2020 5:46:50 PM By: Shawn Stall Entered By: Shawn Stall on 08/13/2020 09:25:22 -------------------------------------------------------------------------------- Wound Assessment Details Patient Name: Date of Service: Mark [name] MIN 08/13/2020 9:00 A M Medical Record Number: [id number] Patient Account Number: [id number] Date of Birth/Sex: Treating RN: [birth year] (20 y.o. [name] Primary Care Leon Goodnow: Ronalee Belts NET Other Clinician: Referring Priscille Shadduck: Treating Syniyah Bourne/Extender: Barbaraann Share NET Weeks in Treatment: 1 Wound Status Wound Number: 1 Primary Etiology: Trauma, Other Wound Location: Right T Great oe Wound Status: Open Wounding Event: Other Lesion Date Acquired: 08/20/2019  Weeks Of Treatment: 1 Clustered Wound: No Photos Wound Measurements Length: (cm) 0.4 Width: (cm) 0.3 Depth: (cm) 0.1 Area: (cm) 0.094 Volume: (cm) 0.009 % Reduction in Area: 55.7% % Reduction in Volume: 78.6% Tunneling: No Undermining: No Wound Description Classification: Full Thickness Without Exposed Support  Structures Wound Margin: Distinct, outline attached Exudate Amount: Medium Exudate Type: Serosanguineous Exudate Color: red, brown Foul Odor After Cleansing: No Slough/Fibrino No Wound Bed Granulation Amount: Large (67-100%) Exposed Structure Granulation Quality: Red Fascia Exposed: No Necrotic Amount: None Present (0%) Fat Layer (Subcutaneous Tissue) Exposed: Yes Tendon Exposed: No Muscle Exposed: No Joint Exposed: No Bone Exposed: No Treatment Notes Wound #1 (Toe Great) Wound Laterality: Right Cleanser Soap and Water Discharge Instruction: May shower and wash wound with dial antibacterial soap and water prior to dressing change. Peri-Wound Care Topical Primary Dressing triple antibiotic ointment- polysporin or neopsorin Discharge Instruction: apply ointment down into corner of right great toe wound. Secondary Dressing bandaid Discharge Instruction: bandaid Secured With Compression Wrap Compression Stockings Add-Ons Electronic Signature(s) Signed: 08/13/2020 4:12:25 PM By: Karl Itoawkins, Destiny Signed: 08/13/2020 5:46:50 PM By: Shawn Stalleaton, Bobbi Entered By: Karl Itoawkins, Destiny on 08/13/2020 16:00:49 -------------------------------------------------------------------------------- Vitals Details Patient Name: Date of Service: HA Gorden HarmsILE, BENJA MIN 08/13/2020 9:00 A M Medical Record Number: [id number] Patient Account Number: [id number] Date of Birth/Sex: Treating RN: 2000-06-30 (20 y.o. Lytle MichaelsM) Barnhart, Jodi Primary Care Lucella Pommier: Ronalee BeltsEES, JA NET Other Clinician: Referring Lempi Edwin: Treating Brock Mokry/Extender: Barbaraann Shareobson, Michael DEES, JA NET Weeks in Treatment: 1 Vital Signs Time Taken: 08:55 Temperature (F): 97.9 Height (in): 70 Pulse (bpm): 86 Weight (lbs): 213 Respiratory Rate (breaths/min): 16 Body Mass Index (BMI): 30.6 Blood Pressure (mmHg): 124/87 Reference Range: 80 - 120 mg / dl Electronic Signature(s) Signed: 08/13/2020 5:27:52 PM By: Antonieta IbaBarnhart, Jodi Entered By: Antonieta IbaBarnhart,  Jodi on 08/13/2020 08:56:16

## 2020-08-27 ENCOUNTER — Encounter (HOSPITAL_BASED_OUTPATIENT_CLINIC_OR_DEPARTMENT_OTHER): Payer: BC Managed Care – PPO | Attending: Internal Medicine | Admitting: Internal Medicine

## 2020-08-27 ENCOUNTER — Other Ambulatory Visit: Payer: Self-pay

## 2020-08-27 DIAGNOSIS — L600 Ingrowing nail: Secondary | ICD-10-CM | POA: Diagnosis not present

## 2020-08-27 DIAGNOSIS — L03031 Cellulitis of right toe: Secondary | ICD-10-CM | POA: Insufficient documentation

## 2020-08-27 NOTE — Progress Notes (Signed)
YONY, ROULSTON ([id number]) Visit Report for 08/27/2020 Arrival Information Details Patient Name: Date of Service: Ulice Dash MIN 08/27/2020 9:00 A M Medical Record Number: [id number] Patient Account Number: [id number] Date of Birth/Sex: Treating RN: [birth year] (20 y.o. Harlon Flor, Millard.Loa Primary Care Lanice Folden: Ronalee Belts NET Other Clinician: Referring Linken Mcglothen: Treating Jakayla Schweppe/Extender: Barbaraann Share NET Weeks in Treatment: 3 Visit Information History Since Last Visit Added or deleted any medications: No Patient Arrived: Ambulatory Any new allergies or adverse reactions: No Arrival Time: 10:36 Had a fall or experienced change in No Accompanied By: self activities of daily living that may affect Transfer Assistance: None risk of falls: Patient Identification Verified: Yes Signs or symptoms of abuse/neglect since last visito No Secondary Verification Process Completed: Yes Hospitalized since last visit: No Patient Requires Transmission-Based Precautions: No Implantable device outside of the clinic excluding No Patient Has Alerts: No cellular tissue based products placed in the center since last visit: Has Dressing in Place as Prescribed: Yes Pain Present Now: No Electronic Signature(s) Signed: 08/27/2020 6:24:11 PM By: Shawn Stall Entered By: Shawn Stall on 08/27/2020 10:36:57 -------------------------------------------------------------------------------- Clinic Level of Care Assessment Details Patient Name: Date of Service: Trey Sailors 08/27/2020 9:00 A M Medical Record Number: [id number] Patient Account Number: [id number] Date of Birth/Sex: Treating RN: 2000-07-07 (20 y.o. [name] Primary Care Emira Eubanks: Ronalee Belts NET Other Clinician: Referring Maretta Overdorf: Treating Curtis Cain/Extender: Barbaraann Share NET Weeks in Treatment: 3 Clinic Level of Care Assessment Items TOOL 4 Quantity Score X- 1 0 Use when only an EandM is performed on FOLLOW-UP  visit ASSESSMENTS - Nursing Assessment / Reassessment X- 1 10 Reassessment of Co-morbidities (includes updates in patient status) X- 1 5 Reassessment of Adherence to Treatment Plan ASSESSMENTS - Wound and Skin A ssessment / Reassessment X - Simple Wound Assessment / Reassessment - one wound 1 5 []  - 0 Complex Wound Assessment / Reassessment - multiple wounds X- 1 10 Dermatologic / Skin Assessment (not related to wound area) ASSESSMENTS - Focused Assessment X- 1 5 Circumferential Edema Measurements - multi extremities []  - 0 Nutritional Assessment / Counseling / Intervention []  - 0 Lower Extremity Assessment (monofilament, tuning fork, pulses) []  - 0 Peripheral Arterial Disease Assessment (using hand held doppler) ASSESSMENTS - Ostomy and/or Continence Assessment and Care []  - 0 Incontinence Assessment and Management []  - 0 Ostomy Care Assessment and Management (repouching, etc.) PROCESS - Coordination of Care X - Simple Patient / Family Education for ongoing care 1 15 []  - 0 Complex (extensive) Patient / Family Education for ongoing care X- 1 10 Staff obtains , Records, T Results / Process Orders est []  - 0 Staff telephones HHA, Nursing Homes / Clarify orders / etc []  - 0 Routine Transfer to another Facility (non-emergent condition) []  - 0 Routine Hospital Admission (non-emergent condition) []  - 0 New Admissions / / Ordering NPWT Apligraf, etc. , []  - 0 Emergency Hospital Admission (emergent condition) X- 1 10 Simple Discharge Coordination []  - 0 Complex (extensive) Discharge Coordination PROCESS - Special Needs []  - 0 Pediatric / Minor Patient Management []  - 0 Isolation Patient Management []  - 0 Hearing / Language / Visual special needs []  - 0 Assessment of Community assistance (transportation, D/C planning, etc.) []  - 0 Additional assistance / Altered mentation []  - 0 Support Surface(s) Assessment (bed, cushion, seat,  etc.) INTERVENTIONS - Wound Cleansing / Measurement X - Simple Wound Cleansing - one wound 1 5 []  - 0 Complex Wound Cleansing -  multiple wounds X- 1 5 Wound Imaging (photographs - any number of wounds) []  - 0 Wound Tracing (instead of photographs) X- 1 5 Simple Wound Measurement - one wound []  - 0 Complex Wound Measurement - multiple wounds INTERVENTIONS - Wound Dressings []  - 0 Small Wound Dressing one or multiple wounds []  - 0 Medium Wound Dressing one or multiple wounds []  - 0 Large Wound Dressing one or multiple wounds []  - 0 Application of Medications - topical []  - 0 Application of Medications - injection INTERVENTIONS - Miscellaneous []  - 0 External ear exam []  - 0 Specimen Collection (cultures, biopsies, blood, body fluids, etc.) []  - 0 Specimen(s) / Culture(s) sent or taken to Lab for analysis []  - 0 Patient Transfer (multiple staff / Nurse, adultHoyer Lift / Similar devices) []  - 0 Simple Staple / Suture removal (25 or less) []  - 0 Complex Staple / Suture removal (26 or more) []  - 0 Hypo / Hyperglycemic Management (close monitor of Blood Glucose) []  - 0 Ankle / Brachial Index (ABI) - do not check if billed separately X- 1 5 Vital Signs Has the patient been seen at the hospital within the last three years: Yes Total Score: 90 Level Of Care: New/Established - Level 3 Electronic Signature(s) Signed: 08/27/2020 6:24:11 PM By: Shawn Stalleaton, Bobbi Entered By: Shawn Stalleaton, Bobbi on 08/27/2020 10:45:55 -------------------------------------------------------------------------------- Encounter Discharge Information Details Patient Name: Date of Service: HA Gorden HarmsILE, BENJA MIN 08/27/2020 9:00 A M Medical Record Number: [id number] Patient Account Number: [id number] Date of Birth/Sex: Treating RN: 08-14-2000 (20 y.o. Tammy SoursM) Deaton, Bobbi Primary Care Mileidy Atkin: Ronalee BeltsEES, JA NET Other Clinician: Referring Maziyah Vessel: Treating Lavonya Hoerner/Extender: Barbaraann Shareobson, Michael DEES, JA NET Weeks in Treatment: 3 Encounter  Discharge Information Items Discharge Condition: Stable Ambulatory Status: Ambulatory Discharge Destination: Home Transportation: Private Auto Accompanied By: self Schedule Follow-up Appointment: No Clinical Summary of Care: Electronic Signature(s) Signed: 08/27/2020 6:24:11 PM By: Shawn Stalleaton, Bobbi Entered By: Shawn Stalleaton, Bobbi on 08/27/2020 10:48:11 -------------------------------------------------------------------------------- Lower Extremity Assessment Details Patient Name: Date of Service: Ulice DashHA ILE, BENJA MIN 08/27/2020 9:00 A M Medical Record Number: [id number] Patient Account Number: [id number] Date of Birth/Sex: Treating RN: 08-14-2000 (20 y.o. Tammy SoursM) Deaton, Bobbi Primary Care Estevon Fluke: Ronalee BeltsEES, JA NET Other Clinician: Referring Keylan Costabile: Treating Shrinika Blatz/Extender: Barbaraann Shareobson, Michael DEES, JA NET Weeks in Treatment: 3 Edema Assessment Assessed: [Left: No] [Right: Yes] Edema: [Left: N] [Right: o] Calf Left: Right: Point of Measurement: 35 cm From Medial Instep 42 cm Ankle Left: Right: Point of Measurement: 10 cm From Medial Instep 23 cm Vascular Assessment Pulses: Dorsalis Pedis Palpable: [Right:Yes] Electronic Signature(s) Signed: 08/27/2020 6:24:11 PM By: Shawn Stalleaton, Bobbi Entered By: Shawn Stalleaton, Bobbi on 08/27/2020 10:37:31 -------------------------------------------------------------------------------- Multi Wound Chart Details Patient Name: Date of Service: Ulice DashHA ILE, BENJA MIN 08/27/2020 9:00 A M Medical Record Number: [id number] Patient Account Number: [id number] Date of Birth/Sex: Treating RN: 08-14-2000 (20 y.o. Tammy SoursM) Deaton, Bobbi Primary Care Robynne Roat: Ronalee BeltsEES, JA NET Other Clinician: Referring Taye Cato: Treating Anja Neuzil/Extender: Barbaraann Shareobson, Michael DEES, JA NET Weeks in Treatment: 3 Vital Signs Height(in): 70 Pulse(bpm): 117 Weight(lbs): 213 Blood Pressure(mmHg): 135/85 Body Mass Index(BMI): 31 Temperature(F): 98.8 Respiratory Rate(breaths/min): 20 Photos: [1:No Photos Right T  Great oe] [N/A:N/A N/A] Wound Location: [1:Other Lesion] [N/A:N/A] Wounding Event: [1:Trauma, Other] [N/A:N/A] Primary Etiology: [1:08/20/2019] [N/A:N/A] Date Acquired: [1:3] [N/A:N/A] Weeks of Treatment: [1:Healed - Epithelialized] [N/A:N/A] Wound Status: [1:0x0x0] [N/A:N/A] Measurements L x W x D (cm) [1:0] [N/A:N/A] A (cm) : rea [1:0] [N/A:N/A] Volume (cm) : [1:100.00%] [N/A:N/A] % Reduction in A rea: [1:100.00%] [N/A:N/A] % Reduction in Volume: [1:Full Thickness  Without Exposed] [N/A:N/A] Classification: [1:Support Structures] Treatment Notes Electronic Signature(s) Signed: 08/27/2020 6:24:11 PM By: Shawn Stall Signed: 08/27/2020 8:35:52 PM By: Baltazar Najjar MD Entered By: Baltazar Najjar on 08/27/2020 10:48:43 -------------------------------------------------------------------------------- Multi-Disciplinary Care Plan Details Patient Name: Date of Service: [name] MIN 08/27/2020 9:00 A M Medical Record Number: [id number] Patient Account Number: [id number] Date of Birth/Sex: Treating RN: 17-Nov-2000 (20 y.o. [name] Primary Care Chad Tiznado: Ronalee Belts NET Other Clinician: Referring Sigfredo Schreier: Treating Vikkie Goeden/Extender: Maricela Bo, JA NET Weeks in Treatment: 3 Active Inactive Electronic Signature(s) Signed: 08/27/2020 6:24:11 PM By: Shawn Stall Entered By: Shawn Stall on 08/27/2020 10:41:15 -------------------------------------------------------------------------------- Pain Assessment Details Patient Name: Date of Service: Ulice Dash MIN 08/27/2020 9:00 A M Medical Record Number: [id number] Patient Account Number: [id number] Date of Birth/Sex: Treating RN: 11/17/2000 (20 y.o. [name] Primary Care Ivanell Deshotel: Ronalee Belts NET Other Clinician: Referring Avonda Toso: Treating Sheliah Fiorillo/Extender: Barbaraann Share NET Weeks in Treatment: 3 Active Problems Location of Pain Severity and Description of Pain Patient Has Paino No Site  Locations Rate the pain. Current Pain Level: 0 Pain Management and Medication Current Pain Management: Medication: No Cold Application: No Rest: No Massage: No Activity: No T.E.N.S.: No Heat Application: No Leg drop or elevation: No Is the Current Pain Management Adequate: Adequate How does your wound impact your activities of daily livingo Sleep: No Bathing: No Appetite: No Relationship With Others: No Bladder Continence: No Emotions: No Bowel Continence: No Work: No Toileting: No Drive: No Dressing: No Hobbies: No Electronic Signature(s) Signed: 08/27/2020 6:24:11 PM By: Shawn Stall Entered By: Shawn Stall on 08/27/2020 10:37:20 -------------------------------------------------------------------------------- Patient/Caregiver Education Details Patient Name: Date of Service: [name] MIN 7/7/2022andnbsp9:00 A M Medical Record Number: [id number] Patient Account Number: [id number] Date of Birth/Gender: Treating RN: June 11, 2000 (20 y.o. [name] Primary Care Physician: Ronalee Belts NET Other Clinician: Referring Physician: Treating Physician/Extender: Barbaraann Share NET Weeks in Treatment: 3 Education Assessment Education Provided To: Patient Education Topics Provided Wound/Skin Impairment: Handouts: Skin Care Do's and Dont's Methods: Explain/Verbal Responses: Reinforcements needed Electronic Signature(s) Signed: 08/27/2020 6:24:11 PM By: Shawn Stall Entered By: Shawn Stall on 08/27/2020 10:41:52 -------------------------------------------------------------------------------- Wound Assessment Details Patient Name: Date of Service: Ulice Dash MIN 08/27/2020 9:00 A M Medical Record Number: [id number] Patient Account Number: [id number] Date of Birth/Sex: Treating RN: 14-Aug-2000 (20 y.o. Harlon Flor, Millard.Loa Primary Care Rosanna Bickle: Ronalee Belts NET Other Clinician: Referring Elmon Shader: Treating Darsha Zumstein/Extender: Barbaraann Share  NET Weeks in Treatment: 3 Wound Status Wound Number: 1 Primary Etiology: Trauma, Other Wound Location: Right T Great oe Wound Status: Healed - Epithelialized Wounding Event: Other Lesion Date Acquired: 08/20/2019 Weeks Of Treatment: 3 Clustered Wound: No Wound Measurements Length: (cm) Width: (cm) Depth: (cm) Area: (cm) Volume: (cm) 0 % Reduction in Area: 100% 0 % Reduction in Volume: 100% 0 0 0 Wound Description Classification: Full Thickness Without Exposed Support Structur es Electronic Signature(s) Signed: 08/27/2020 6:24:11 PM By: Shawn Stall Entered By: Shawn Stall on 08/27/2020 10:37:37 -------------------------------------------------------------------------------- Vitals Details Patient Name: Date of Service: [name] MIN 08/27/2020 9:00 A M Medical Record Number: [id number] Patient Account Number: [id number] Date of Birth/Sex: Treating RN: 05-22-2000 (20 y.o. [name] Primary Care Keshanna Riso: Ronalee Belts NET Other Clinician: Referring Gordie Crumby: Treating Miking Usrey/Extender: Barbaraann Share NET Weeks in Treatment: 3 Vital Signs Time Taken: 10:36 Temperature (F): 98.8 Height (in): 70 Pulse (bpm): 117 Weight (lbs): 213 Respiratory Rate (breaths/min): 20 Body Mass Index (BMI): 30.6 Blood  Pressure (mmHg): 135/85 Reference Range: 80 - 120 mg / dl Electronic Signature(s) Signed: 08/27/2020 6:24:11 PM By: Shawn Stall Entered By: Shawn Stall on 08/27/2020 10:37:12

## 2020-08-27 NOTE — Progress Notes (Signed)
Mark [name] ([id number]) Visit Report for 08/27/2020 HPI Details Patient Name: Date of Service: Mark [name] MIN 08/27/2020 9:00 A M Medical Record Number: [id number] Patient Account Number: [id number] Date of Birth/Sex: Treating RN: [birth year] (20 y.o. Mark [name] Primary Care Provider: Ronalee [name] NET Other Clinician: Referring Provider: Treating Provider/Extender: Mark [name] NET Weeks in Treatment: 3 History of Present Illness HPI Description: ADMISSION 08/06/2020 This is a 20 year old healthy young man. The only thing I can see in [place] link is that he was seen by podiatry in 2015x2 visits and then again a year later in 2016. Most recently by Mark [name]. 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 He underwent a partial nail removal on 08/22/2014 he was recommended to do Epson salts and topical antibiotics. The patient states that this resolved he states about a year ago he had started having a problem again. He has tried Epson salts again and topical antibiotics but it has not really helped. He says it drains minimally and the pain is minimal. I am not really convinced he has been using anything recently. Past medical history includes ADHD. He is not a diabetic 6/23; this is a patient with a chronic paronychia. There was purulent drainage coming out of this last time culture grew abundant methicillin staph aureus as well as moderate Streptococcus. Based on this I gave him Keflex 500 every 6 for 7 days he is also using topical Polysporin. 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 We cultured staph MSSA and strep. I gave him Keflex. The area is a lot better. He has been using topical Polysporin and keeping the clean area clean and dry. He may have an ingrown toenail in this area I am still not convinced that he is not going to have to have this removed especially if this problem becomes  recurrent Electronic Signature(s) Signed: 08/27/2020 8:35:52 PM By: Mark Najjar MD Entered By: Mark [name] on 08/27/2020 10:49:39 -------------------------------------------------------------------------------- Physical Exam Details Patient Name: Date of Service: HA Mark [name] MIN 08/27/2020 9:00 A M Medical Record Number: [id number] Patient Account Number: [id number] Date of Birth/Sex: Treating RN: June 16, 2000 (20 y.o. Mark [name] Primary Care Provider: Ronalee [name] NET Other Clinician: Referring Provider: Treating Provider/Extender: Mark [name] NET Weeks in Treatment: 3 Constitutional Sitting or standing Blood Pressure is within target range for patient.. Pulse regular and within target range for patient.Marland Kitchen Respirations regular, non-labored and within target range.. Temperature is normal and within the target range for the patient.Marland Kitchen Appears in no distress. Notes Wound exam; left medial first toenail. I think this is largely resolved although still looks a bit unhealthy. Electronic Signature(s) Signed: 08/27/2020 8:35:52 PM By: Mark Najjar MD Entered By: Mark [name] on 08/27/2020 10:50:17 -------------------------------------------------------------------------------- Physician Orders Details Patient Name: Date of Service: Mark [name] MIN 08/27/2020 9:00 A M Medical Record Number: [id number] Patient Account Number: [id number] Date of Birth/Sex: Treating RN: 10-Mar-2000 (20 y.o. Mark [name] Primary Care Provider: Ronalee [name] NET Other Clinician: Referring Provider: Treating Provider/Extender: Mark [name] NET Weeks in Treatment: 3 Verbal / Phone Orders: No Diagnosis Coding ICD-10 Coding Code Description L03.031 Cellulitis of right toe L60.0 Ingrowing nail Discharge From [facility] Discharge from Wound Care Center - call if any future wound care needs. Electronic Signature(s) Signed: 08/27/2020 6:24:11 PM By: Mark [name] Signed:  08/27/2020 8:35:52 PM By: Mark Najjar MD Entered By: Mark [name] on 08/27/2020  10:42:23 -------------------------------------------------------------------------------- Problem List Details Patient Name: Date of Service: Mark [name] MIN 08/27/2020 9:00 A M Medical Record Number: [id number] Patient Account Number: [id number] Date of Birth/Sex: Treating RN: [birth year] (20 y.o. Mark [name] Primary Care Provider: Ronalee [name] NET Other Clinician: Referring Provider: Treating Provider/Extender: Mark [name], JA NET Weeks in Treatment: 3 Active Problems ICD-10 Encounter Code Description Active Date MDM Diagnosis L03.031 Cellulitis of right toe 08/06/2020 No Yes L60.0 Ingrowing nail 08/06/2020 No Yes Inactive Problems Resolved Problems Electronic Signature(s) Signed: 08/27/2020 8:35:52 PM By: Mark Najjar MD Entered By: Mark [name] on 08/27/2020 10:48:27 -------------------------------------------------------------------------------- Progress Note Details Patient Name: Date of Service: HA Mark [name] MIN 08/27/2020 9:00 A M Medical Record Number: [id number] Patient Account Number: [id number] Date of Birth/Sex: Treating RN: April 12, 2000 (20 y.o. Mark [name] Primary Care Provider: Ronalee [name] NET Other Clinician: Referring Provider: Treating Provider/Extender: Mark [name] NET Weeks in Treatment: 3 Subjective History of Present Illness (HPI) ADMISSION 08/06/2020 This is a 20 year old healthy young man. The only thing I can see in [place] link is that he was seen by podiatry in 2015x2 visits and then again a year later in 2016. Most recently by Mark [name]. 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 He underwent a partial nail removal on 08/22/2014 he was recommended to do Epson salts and topical antibiotics. The patient states that this resolved he states about a year ago he had started having a problem again. He has tried  Epson salts again and topical antibiotics but it has not really helped. He says it drains minimally and the pain is minimal. I am not really convinced he has been using anything recently. Past medical history includes ADHD. He is not a diabetic 6/23; this is a patient with a chronic paronychia. There was purulent drainage coming out of this last time culture grew abundant methicillin staph aureus as well as moderate Streptococcus. Based on this I gave him Keflex 500 every 6 for 7 days he is also using topical Polysporin. 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 We cultured staph MSSA and strep. I gave him Keflex. The area is a lot better. He has been using topical Polysporin and keeping the clean area clean and dry. He may have an ingrown toenail in this area I am still not convinced that he is not going to have to have this removed especially if this problem becomes recurrent Objective Constitutional Sitting or standing Blood Pressure is within target range for patient.. Pulse regular and within target range for patient.Marland Kitchen Respirations regular, non-labored and within target range.. Temperature is normal and within the target range for the patient.Marland Kitchen Appears in no distress. Vitals Time Taken: 10:36 AM, Height: 70 in, Weight: 213 lbs, BMI: 30.6, Temperature: 98.8 F, Pulse: 117 bpm, Respiratory Rate: 20 breaths/min, Blood Pressure: 135/85 mmHg. General Notes: Wound exam; left medial first toenail. I think this is largely resolved although still looks a bit unhealthy. Integumentary (Hair, Skin) Wound #1 status is Healed - Epithelialized. Original cause of wound was Other Lesion. The date acquired was: 08/20/2019. The wound has been in treatment 3 weeks. The wound is located on the Right T Great. The wound measures 0cm length x 0cm width x 0cm depth; 0cm^2 area and 0cm^3 volume. oe Assessment Active Problems ICD-10 Cellulitis of right toe Ingrowing  nail Plan Discharge From [facility] Services: Discharge from Wound Care Center - call if any  future wound care needs. 1. The patient can be discharged from the wound care center 2. I still recommended episodic use of topical antibiotics. If he continues to have this he may need to see podiatry to have the nail removed from this site. I think he was well on his way to getting this consultation when he first came in here. Electronic Signature(s) Signed: 08/27/2020 8:35:52 PM By: Mark Najjar MD Entered By: Mark [name] on 08/27/2020 10:50:57 -------------------------------------------------------------------------------- SuperBill Details Patient Name: Date of Service: Mark [name] MIN 08/27/2020 Medical Record Number: [id number] Patient Account Number: [id number] Date of Birth/Sex: Treating RN: 04-14-2000 (20 y.o. Mark [name] Primary Care Provider: Ronalee [name] NET Other Clinician: Referring Provider: Treating Provider/Extender: Mark [name] NET Weeks in Treatment: 3 Diagnosis Coding ICD-10 Codes Code Description L03.031 Cellulitis of right toe L60.0 Ingrowing nail Facility Procedures CPT4 Code: [id number] Description: 99213 - WOUND CARE VISIT-LEV 3 EST PT Modifier: Quantity: 1 Physician Procedures : CPT4 Code Description Modifier [id number] - WC PHYS LEVEL 2 - EST PT ICD-10 Diagnosis Description L03.031 Cellulitis of right toe L60.0 Ingrowing nail Quantity: 1 Electronic Signature(s) Signed: 08/27/2020 8:35:52 PM By: Mark Najjar MD Entered By: Mark [name] on 08/27/2020 10:51:14

## 2021-03-29 ENCOUNTER — Other Ambulatory Visit: Payer: Self-pay

## 2021-03-29 ENCOUNTER — Encounter: Payer: Self-pay | Admitting: Podiatry

## 2021-03-29 ENCOUNTER — Ambulatory Visit (INDEPENDENT_AMBULATORY_CARE_PROVIDER_SITE_OTHER): Payer: BC Managed Care – PPO

## 2021-03-29 ENCOUNTER — Ambulatory Visit: Payer: BC Managed Care – PPO

## 2021-03-29 ENCOUNTER — Ambulatory Visit: Payer: BC Managed Care – PPO | Admitting: Podiatry

## 2021-03-29 DIAGNOSIS — L03031 Cellulitis of right toe: Secondary | ICD-10-CM

## 2021-03-29 DIAGNOSIS — L600 Ingrowing nail: Secondary | ICD-10-CM

## 2021-03-29 DIAGNOSIS — M79672 Pain in left foot: Secondary | ICD-10-CM

## 2021-03-29 DIAGNOSIS — M79671 Pain in right foot: Secondary | ICD-10-CM | POA: Diagnosis not present

## 2021-03-29 NOTE — Patient Instructions (Signed)

## 2021-03-31 ENCOUNTER — Other Ambulatory Visit: Payer: Self-pay | Admitting: Podiatry

## 2021-03-31 DIAGNOSIS — L600 Ingrowing nail: Secondary | ICD-10-CM

## 2021-03-31 NOTE — Progress Notes (Signed)
Subjective:   Patient ID: Mark [name], male   DOB: 21 y.o.   MRN: [id number]   HPI Patient presents with chronic ingrown toenail right big toe stating its been going on for around a year and has had some redness and drainage associated with it over that time with redness and drainage noted today.  Patient states that it has been this way but gotten worse recently.  Patient does not smoke likes to be active   Review of Systems  All other systems reviewed and are negative.      Objective:  Physical Exam Vitals and nursing note reviewed.  Constitutional:      Appearance: He is well-developed.  Pulmonary:     Effort: Pulmonary effort is normal.  Musculoskeletal:        General: Normal range of motion.  Skin:    General: Skin is warm.  Neurological:     Mental Status: He is alert.    Neurovascular status found to be intact muscle strength was found to be adequate range of motion adequate with inflamed red hallux right with fluid buildup around the medial side and indications of structural pathology of the nailbed itself.  It is extended to the distal interphalangeal joint and distal to this point and there is swelling associated with it.  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  This will be a two-stage process due to the redness and drainage present and I did as precautionary make sure x-ray of the area.  I then infiltrated the right hallux 60 mg like Marcaine mixture sterile prep done using sterile instrumentation remove the medial border removed all proud flesh abscessed tissue created a channel for drainage and advised on soaks.  Sterile dressing applied and patient will be seen back in 2 to 3 weeks for permanent procedure or earlier if any issues were to occur

## 2021-04-12 ENCOUNTER — Encounter: Payer: Self-pay | Admitting: Podiatry

## 2021-04-12 ENCOUNTER — Other Ambulatory Visit: Payer: Self-pay

## 2021-04-12 ENCOUNTER — Ambulatory Visit: Payer: BC Managed Care – PPO | Admitting: Podiatry

## 2021-04-12 DIAGNOSIS — L600 Ingrowing nail: Secondary | ICD-10-CM | POA: Diagnosis not present

## 2021-04-12 NOTE — Patient Instructions (Signed)

## 2021-04-13 NOTE — Progress Notes (Signed)
Subjective:   Patient ID: Mark [name], male   DOB: 21 y.o.   MRN: [id number]   HPI Patient presents with ingrown toenail right big toe medial border states its been sore and that he is very excited to get it fixed permanently the infection is resolved   ROS      Objective:  Physical Exam  Neurovascular status intact with the patient's right hallux medial border incurvated sore but no erythema edema or drainage noted     Assessment:  Well-healed paronychia infection right hallux with ingrown toenail deformity     Plan:  H&P reviewed condition recommended correction explained procedure risk and patient wants surgery.  After extensive review patient signed consent form understanding review and understanding risk and today I infiltrated 60 mg like Marcaine mixture sterile prep done and using sterile instrumentation removed the medial border exposed matrix applied phenol 3 applications 30 seconds followed by alcohol by sterile dressing gave instructions on soaks and to leave dressing on 24 hours but take it off earlier if throbbing were to occur and to call with any questions concerns which may arise
